# Patient Record
Sex: Male | Born: 2000 | Race: Black or African American | Hispanic: No | Marital: Single | Smoking: Current every day smoker
Health system: Southern US, Community
[De-identification: ages and names within clinical notes are randomized; demographics above are authoritative.]

## PROBLEM LIST (undated history)

## (undated) DIAGNOSIS — Y249XXA Unspecified firearm discharge, undetermined intent, initial encounter: Principal | ICD-10-CM

---
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
## 2021-10-29 ENCOUNTER — Ambulatory Visit (INDEPENDENT_AMBULATORY_CARE_PROVIDER_SITE_OTHER): Payer: Medicaid Other

## 2021-10-29 ENCOUNTER — Other Ambulatory Visit: Payer: Self-pay

## 2021-10-29 ENCOUNTER — Ambulatory Visit (HOSPITAL_COMMUNITY)
Admission: EM | Admit: 2021-10-29 | Discharge: 2021-10-29 | Disposition: A | Discharge status: Home / Self Care | Payer: Medicaid Other | Attending: Emergency Medicine | Admitting: Emergency Medicine

## 2021-10-29 ENCOUNTER — Encounter (HOSPITAL_COMMUNITY): Payer: Self-pay

## 2021-10-29 DIAGNOSIS — R509 Fever, unspecified: Secondary | ICD-10-CM

## 2021-10-29 DIAGNOSIS — J9801 Acute bronchospasm: Secondary | ICD-10-CM | POA: Diagnosis present

## 2021-10-29 DIAGNOSIS — Z20822 Contact with and (suspected) exposure to covid-19: Secondary | ICD-10-CM | POA: Diagnosis present

## 2021-10-29 DIAGNOSIS — R059 Cough, unspecified: Secondary | ICD-10-CM

## 2021-10-29 DIAGNOSIS — R062 Wheezing: Secondary | ICD-10-CM | POA: Diagnosis not present

## 2021-10-29 DIAGNOSIS — J069 Acute upper respiratory infection, unspecified: Secondary | ICD-10-CM | POA: Diagnosis present

## 2021-10-29 MED ORDER — AEROCHAMBER PLUS MISC: 2 refills | Status: DC

## 2021-10-29 MED ORDER — PROMETHAZINE-DM 6.25-15 MG/5ML PO SYRP: 5.0000 mL | ORAL_SOLUTION | Freq: Four times a day (QID) | ORAL | 0 refills | Status: DC | PRN

## 2021-10-29 MED ORDER — FLUTICASONE PROPIONATE 50 MCG/ACT NA SUSP: 2.0000 | Freq: Every day | NASAL | 0 refills | Status: DC

## 2021-10-29 MED ORDER — ALBUTEROL SULFATE HFA 108 (90 BASE) MCG/ACT IN AERS: 1.0000 | INHALATION_SPRAY | RESPIRATORY_TRACT | 0 refills | Status: DC | PRN

## 2021-10-29 MED ORDER — IBUPROFEN 600 MG PO TABS: 600.0000 mg | ORAL_TABLET | Freq: Four times a day (QID) | ORAL | 0 refills | Status: DC | PRN

## 2021-10-29 NOTE — Discharge Instructions (Addendum)
2 puffs from your albuterol inhaler using your spacer every 4 hours for 2 days, then every 6 hours for 2 days, then as needed.  You may back off on the albuterol if you start to feel better sooner.  Your COVID test will be back tomorrow.  If it is positive, you will need to mask for an additional 5 days.  Flonase, saline nasal irrigation with a Lloyd Huger Med rinse and distilled water as often as you want to prevent sinus infection, Mucinex D.  Promethazine DM for cough.  Chest x-ray was negative for pneumonia.  Below is a list of primary care practices who are taking new patients for you to follow-up with.  [facility] -multiple locations.  See website at [email or web address]  [facility] internal medicine clinic Ground Floor - [facility], [address] [place], [place], [place] [address] [phone]  [facility] at  [facility] [address] [place], [place] [address] [phone]  [facility] [address] [place], [place] [address] [phone]  [facility] [phone] [address] [place] [address]  [facility]: [address] [place] [address]  [phone]  [facility]: [address] [place] [address]  [phone]  [facility] : [address] [place] [address] [phone]  [facility]: [address] [place] [address] [phone]  [facility]: [address] [place] [address] [phone]  Dr. Oneal Grout [address] [facility] [place] [address]  [phone]  Go to www.goodrx.com  or www.[email or web address] to look up your medications. This will give you a list of where you can find your prescriptions at the most  affordable prices. Or ask the pharmacist what the cash price is, or if they have any other discount programs available to help make your medication more affordable. This can be less expensive than what you would pay with insurance.

## 2021-10-29 NOTE — ED Provider Notes (Signed)
HPI  SUBJECTIVE:  Eddie Huber is a 21 y.o. male who presents with feeling feverish, body aches, nasal congestion, rhinorrhea, postnasal drip, sore throat, cough productive of the same material as his nasal congestion, wheezing, shortness of breath and sharp chest pain present with coughing only, nausea, diarrhea.  States that he is waking up at night coughing.  No headaches, loss of sense of smell or taste, vomiting, abdominal pain, sinus pain or pressure.  No documented fevers, but does not have a thermometer at home.  No known COVID or flu exposure.  He did not get the COVID or flu vaccine.  No antipyretic in the past 6 hours.  He has been taking 400 to 600 mg of ibuprofen several times a day, DayQuil and NyQuil all of which help his symptoms.  No aggravating factors.  He has a past medical history of COVID in December 2021.  No history of asthma.  PMD: None.   History reviewed. No pertinent past medical history.  History reviewed. No pertinent surgical history.  History reviewed. No pertinent family history.  Social History   Tobacco Use   Smoking status: Never   Smokeless tobacco: Never  Substance Use Topics   Alcohol use: Not Currently   Drug use: Not Currently    No current facility-administered medications for this encounter.  Current Outpatient Medications:    albuterol (VENTOLIN HFA) 108 (90 Base) MCG/ACT inhaler, Inhale 1-2 puffs into the lungs every 4 (four) hours as needed for wheezing or shortness of breath., Disp: 1 each, Rfl: 0   fluticasone (FLONASE) 50 MCG/ACT nasal spray, Place 2 sprays into both nostrils daily., Disp: 16 g, Rfl: 0   ibuprofen (ADVIL) 600 MG tablet, Take 1 tablet (600 mg total) by mouth every 6 (six) hours as needed., Disp: 30 tablet, Rfl: 0   promethazine-dextromethorphan (PROMETHAZINE-DM) 6.25-15 MG/5ML syrup, Take 5 mLs by mouth 4 (four) times daily as needed for cough., Disp: 118 mL, Rfl: 0   Spacer/Aero-Holding Chambers (AEROCHAMBER PLUS)  inhaler, Use with inhaler, Disp: 1 each, Rfl: 2  No Known Allergies   ROS  As noted in HPI.   Physical Exam  BP 126/84 (BP Location: Left Arm)    Pulse 75    Temp 98.9 F (37.2 C) (Oral)    Resp 18    SpO2 95%   Constitutional: Well developed, well nourished, no acute distress Eyes:  EOMI, conjunctiva normal bilaterally HENT: Normocephalic, atraumatic,mucus membranes moist.  Positive erythematous, swollen turbinates, clear nasal congestion.  Positive mild maxillary, frontal sinus tenderness.  Positive postnasal drip. Neck: Positive cervical lymphadenopathy Respiratory: Normal inspiratory effort, wheezing throughout all lung fields.  Positive lateral chest wall tenderness Cardiovascular: Normal rate, regular rhythm, no murmurs rubs or gallops GI: nondistended skin: No rash, skin intact Musculoskeletal: no deformities Neurologic: Alert & oriented x 3, no focal neuro deficits Psychiatric: Speech and behavior appropriate   ED Course   Medications - No data to display  Orders Placed This Encounter  Procedures   SARS CORONAVIRUS 2 (TAT 6-24 HRS) Nasopharyngeal Nasopharyngeal Swab    Standing Status:   Standing    Number of Occurrences:   1   DG Chest 2 View    Standing Status:   Standing    Number of Occurrences:   1    Order Specific Question:   Reason for Exam (SYMPTOM  OR DIAGNOSIS REQUIRED)    Answer:   Fever, cough, wheezing rule out pneumonia    No results found for this or  any previous visit (from the past 24 hour(s)). DG Chest 2 View  Result Date: 10/29/2021 CLINICAL DATA:  Fever, cough and wheezing.  Question pneumonia. EXAM: CHEST - 2 VIEW COMPARISON:  None. FINDINGS: Heart and mediastinal structures are normal. The lungs are clear. There may be mild central bronchial thickening. No infiltrate, collapse or effusion. No bone abnormality. IMPRESSION: No pneumonia, collapse or effusion. There may be mild central bronchial thickening as might be seen with bronchitis or  reactive airway disease. Electronically Signed   By: Paulina Fusi M.D.   On: 10/29/2021 11:39    ED Clinical Impression  1. Upper respiratory tract infection, unspecified type   2. Encounter for laboratory testing for COVID-19 virus   3. Bronchospasm      ED Assessment/Plan  Checking COVID, although discussed with patient and mother he will be out of the treatment window for COVID by the time the test comes.  He does qualify for antivirals based on unvaccinated status, however, I do not think we need to start him on antivirals empirically because he is otherwise at low risk for severe disease.  Checking a chest x-ray to rule out pneumonia.  Reviewed imaging independently.  No pneumonia.  Maybe some central bronchial thickening consistent with bronchitis or reactive airway disease.  See radiology report for full details.  Chest x-ray negative for pneumonia.  Patient with URI and bronchospasm, possible bronchitis.   Home with regularly scheduled albuterol inhaler with a spacer, Flonase, Mucinex D, Promethazine DM.   If COVID is positive, he will need to mask for an additional 5 days.  Will provide primary care list and order assistance in finding PMD.  ER return precautions given  Discussed labs, imaging, MDM, treatment plan, and plan for follow-up with patient and parent.  Discussed sn/sx that should prompt return to the ED. they agree with plan.   Meds ordered this encounter  Medications   fluticasone (FLONASE) 50 MCG/ACT nasal spray    Sig: Place 2 sprays into both nostrils daily.    Dispense:  16 g    Refill:  0   albuterol (VENTOLIN HFA) 108 (90 Base) MCG/ACT inhaler    Sig: Inhale 1-2 puffs into the lungs every 4 (four) hours as needed for wheezing or shortness of breath.    Dispense:  1 each    Refill:  0   Spacer/Aero-Holding Chambers (AEROCHAMBER PLUS) inhaler    Sig: Use with inhaler    Dispense:  1 each    Refill:  2    Please educate patient on use   ibuprofen (ADVIL)  600 MG tablet    Sig: Take 1 tablet (600 mg total) by mouth every 6 (six) hours as needed.    Dispense:  30 tablet    Refill:  0   promethazine-dextromethorphan (PROMETHAZINE-DM) 6.25-15 MG/5ML syrup    Sig: Take 5 mLs by mouth 4 (four) times daily as needed for cough.    Dispense:  118 mL    Refill:  0      *This clinic note was created using Scientist, clinical (histocompatibility and immunogenetics). Therefore, there may be occasional mistakes despite careful proofreading.  ?    Domenick Gong, MD 10/29/21 [phone]

## 2021-10-29 NOTE — ED Triage Notes (Signed)
Pt c/o cough, congestion, sore throat, and body aches since Saturday. Taking OTC meds with some relief.

## 2021-10-30 LAB — SARS CORONAVIRUS 2 (TAT 6-24 HRS): SARS Coronavirus 2: NEGATIVE

## 2022-02-28 ENCOUNTER — Emergency Department (HOSPITAL_COMMUNITY)
Admission: EM | Admit: 2022-02-28 | Discharge: 2022-02-28 | Disposition: A | Discharge status: Home / Self Care | Payer: Medicaid Other | Attending: Emergency Medicine | Admitting: Emergency Medicine

## 2022-02-28 ENCOUNTER — Encounter (HOSPITAL_COMMUNITY): Payer: Self-pay | Admitting: Emergency Medicine

## 2022-02-28 DIAGNOSIS — Z202 Contact with and (suspected) exposure to infections with a predominantly sexual mode of transmission: Secondary | ICD-10-CM | POA: Diagnosis present

## 2022-02-28 LAB — HIV ANTIBODY (ROUTINE TESTING W REFLEX): HIV Screen 4th Generation wRfx: NONREACTIVE

## 2022-02-28 MED ORDER — CEFTRIAXONE SODIUM 500 MG IJ SOLR
500.0000 mg | Freq: Once | INTRAMUSCULAR | Status: AC
  Administered 2022-02-28: 500 mg via INTRAMUSCULAR
  Filled 2022-02-28: qty 500

## 2022-02-28 MED ORDER — DOXYCYCLINE HYCLATE 100 MG PO TABS: 100.0000 mg | ORAL_TABLET | Freq: Two times a day (BID) | ORAL | 0 refills | Status: DC

## 2022-02-28 MED ORDER — LIDOCAINE HCL (PF) 1 % IJ SOLN
1.0000 mL | Freq: Once | INTRAMUSCULAR | Status: AC
  Administered 2022-02-28: 2 mL
  Filled 2022-02-28: qty 5

## 2022-02-28 MED ORDER — PENICILLIN G BENZATHINE 1200000 UNIT/2ML IM SUSY
1.2000 10*6.[IU] | PREFILLED_SYRINGE | Freq: Once | INTRAMUSCULAR | Status: AC
  Administered 2022-02-28: 1.2 10*6.[IU] via INTRAMUSCULAR
  Filled 2022-02-28 (×2): qty 2

## 2022-02-28 NOTE — Discharge Instructions (Addendum)
You were treated with antibiotics that should cover you for chlamydia, gonorrhea, and syphilis at this time.  However your results are still pending.  Please check your patient portal for the results of your STI screening in 24 to 48 hours.  If you continue to have symptoms or concerns please follow-up with [facility] community [facility] for further evaluation and treatment or return to the emergency department if your symptoms significantly worsen.  In the meantime I recommend that you refrain from any sexual activity until you complete all of your treatment, and inform any recent or current sexual partners of your exposure.

## 2022-02-28 NOTE — ED Triage Notes (Signed)
Pt. Stated, My girlfriend tested positive for Chamidyda and syphilis yesterday and ive been with her.

## 2022-02-28 NOTE — ED Provider Notes (Signed)
[facility] EMERGENCY DEPARTMENT Provider Note   CSN: [id number] Arrival date & time: 02/28/22  1307     History  Chief Complaint  Patient presents with   Exposure to STD    Eddie Huber is a 21 y.o. male with no significant past medical history presents with concern for STI exposure.  Patient reports that he has been with the same girl for the last few months, she recently reported that she was positive for syphilis and chlamydia.  Patient reports that he has not had any symptoms of penile discharge, dysuria, blood in urine, difficulty with erection, testicular pain, fever, chills, nausea, vomiting.  Patient does endorse some bumps on his penis that been present for a long time, or longer than his exposure to this partner.  He denies any other sores or lesions on the penis.   Exposure to STD      Home Medications Prior to Admission medications   Medication Sig Start Date End Date Taking? Authorizing Provider  doxycycline (VIBRA-TABS) 100 MG tablet Take 1 tablet (100 mg total) by mouth 2 (two) times daily. 02/28/22  Yes Kendrew Paci H, PA-C  albuterol (VENTOLIN HFA) 108 (90 Base) MCG/ACT inhaler Inhale 1-2 puffs into the lungs every 4 (four) hours as needed for wheezing or shortness of breath. 10/29/21   Melynda Ripple, MD  fluticasone (FLONASE) 50 MCG/ACT nasal spray Place 2 sprays into both nostrils daily. 10/29/21   Melynda Ripple, MD  ibuprofen (ADVIL) 600 MG tablet Take 1 tablet (600 mg total) by mouth every 6 (six) hours as needed. 10/29/21   Melynda Ripple, MD  promethazine-dextromethorphan (PROMETHAZINE-DM) 6.25-15 MG/5ML syrup Take 5 mLs by mouth 4 (four) times daily as needed for cough. 10/29/21   Melynda Ripple, MD  Spacer/Aero-Holding Chambers (AEROCHAMBER PLUS) inhaler Use with inhaler 10/29/21   Melynda Ripple, MD      Allergies    Patient has no known allergies.    Review of Systems   Review of Systems  All other systems reviewed  and are negative.  Physical Exam Updated Vital Signs BP 132/70 (BP Location: Right Arm)   Pulse 90   Temp 97.7 F (36.5 C) (Oral)   Resp 18   SpO2 98%  Physical Exam Vitals and nursing note reviewed. Exam conducted with a chaperone present.  Constitutional:      General: He is not in acute distress.    Appearance: Normal appearance.  HENT:     Head: Normocephalic and atraumatic.  Eyes:     General:        Right eye: No discharge.        Left eye: No discharge.  Cardiovascular:     Rate and Rhythm: Normal rate and regular rhythm.  Pulmonary:     Effort: Pulmonary effort is normal. No respiratory distress.  Genitourinary:    Comments: Overall normal appearance of penis with some small bumps on the shaft without redness, or ulceration.  DDx HPV versus folliculitis versus other.  No evidence of ulceration, chancre, normal appearance of testicles.  No penile discharge noted. Musculoskeletal:        General: No deformity.  Skin:    General: Skin is warm and dry.  Neurological:     Mental Status: He is alert and oriented to person, place, and time.  Psychiatric:        Mood and Affect: Mood normal.        Behavior: Behavior normal.    ED Results / Procedures /  Treatments   Labs (all labs ordered are listed, but only abnormal results are displayed) Labs Reviewed  RPR  HIV ANTIBODY (ROUTINE TESTING W REFLEX)  GC/CHLAMYDIA PROBE AMP ([place]) NOT AT [facility]    EKG None  Radiology No results found.  Procedures Procedures    Medications Ordered in ED Medications  cefTRIAXone (ROCEPHIN) injection 500 mg (has no administration in time range)  lidocaine (PF) (XYLOCAINE) 1 % injection 1-2.1 mL (has no administration in time range)  penicillin g benzathine (BICILLIN LA) 1200000 UNIT/2ML injection 1.2 Million Units (has no administration in time range)    ED Course/ Medical Decision Making/ A&P                           Medical Decision Making Amount and/or  Complexity of Data Reviewed Labs: ordered.  Risk Prescription drug management.   This an overall well-appearing 21 year old male who presents with concern for STI exposure.  Patient reports that he is not currently having any symptoms. We discussed treatment and testing for STIs at this time.  We will test for chlamydia, gonorrhea, syphilis as well as HIV.  Low concern for epididymitis, orchitis, or other severe infection based on patient's clinical presentation.  Discussed I would recommend presumptive treatment based on positive diagnosis of girlfriend, and follow-up based on results of STI screening with [facility].  Patient understands and agrees to this plan.  Encourage safe sexual practices, and avoiding sexual contact until he has completed his treatment and is completely symptom-free.  Encouraged to inform other sexual partners.  Treated with IM penicillin, ceftriaxone, as well as oral doxycycline for 2 weeks to cover for chlamydia, gonorrhea, and syphilis. Final Clinical Impression(s) / ED Diagnoses Final diagnoses:  STD exposure    Rx / DC Orders ED Discharge Orders          Ordered    doxycycline (VIBRA-TABS) 100 MG tablet  2 times daily        02/28/22 1330              Lehman Whiteley, [place], PA-C 02/28/22 1342    Milton Ferguson, MD 03/01/22 1034

## 2022-03-01 LAB — RPR: RPR Ser Ql: NONREACTIVE

## 2022-03-04 LAB — GC/CHLAMYDIA PROBE AMP (~~LOC~~) NOT AT ARMC
Chlamydia: POSITIVE — AB
Comment: NEGATIVE
Comment: NORMAL
Neisseria Gonorrhea: NEGATIVE

## 2022-05-26 IMAGING — DX DG CHEST 2V
3 series · 3 of 3 positions shown · non-contrast
Comparison: None.

CLINICAL DATA: Fever, cough and wheezing.  Question pneumonia.

EXAM:
CHEST - 2 VIEW

[chest pa]
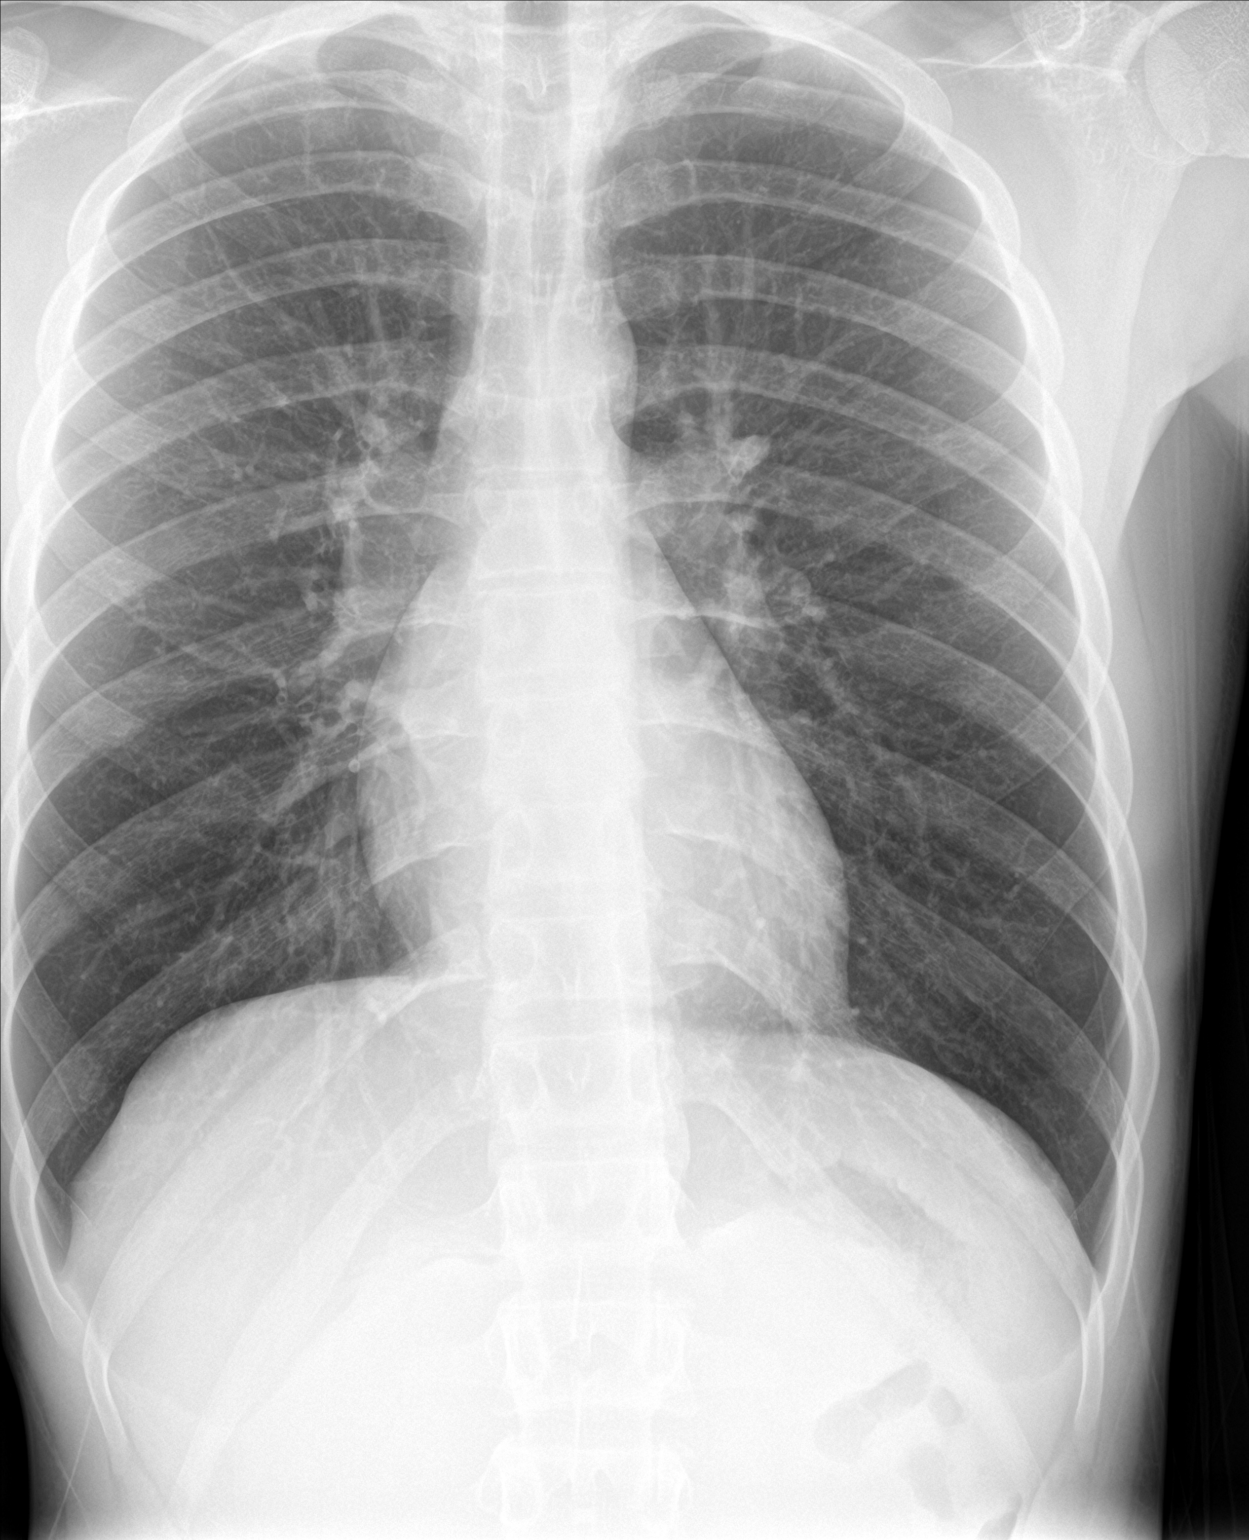

[chest lat (1 of 2)]
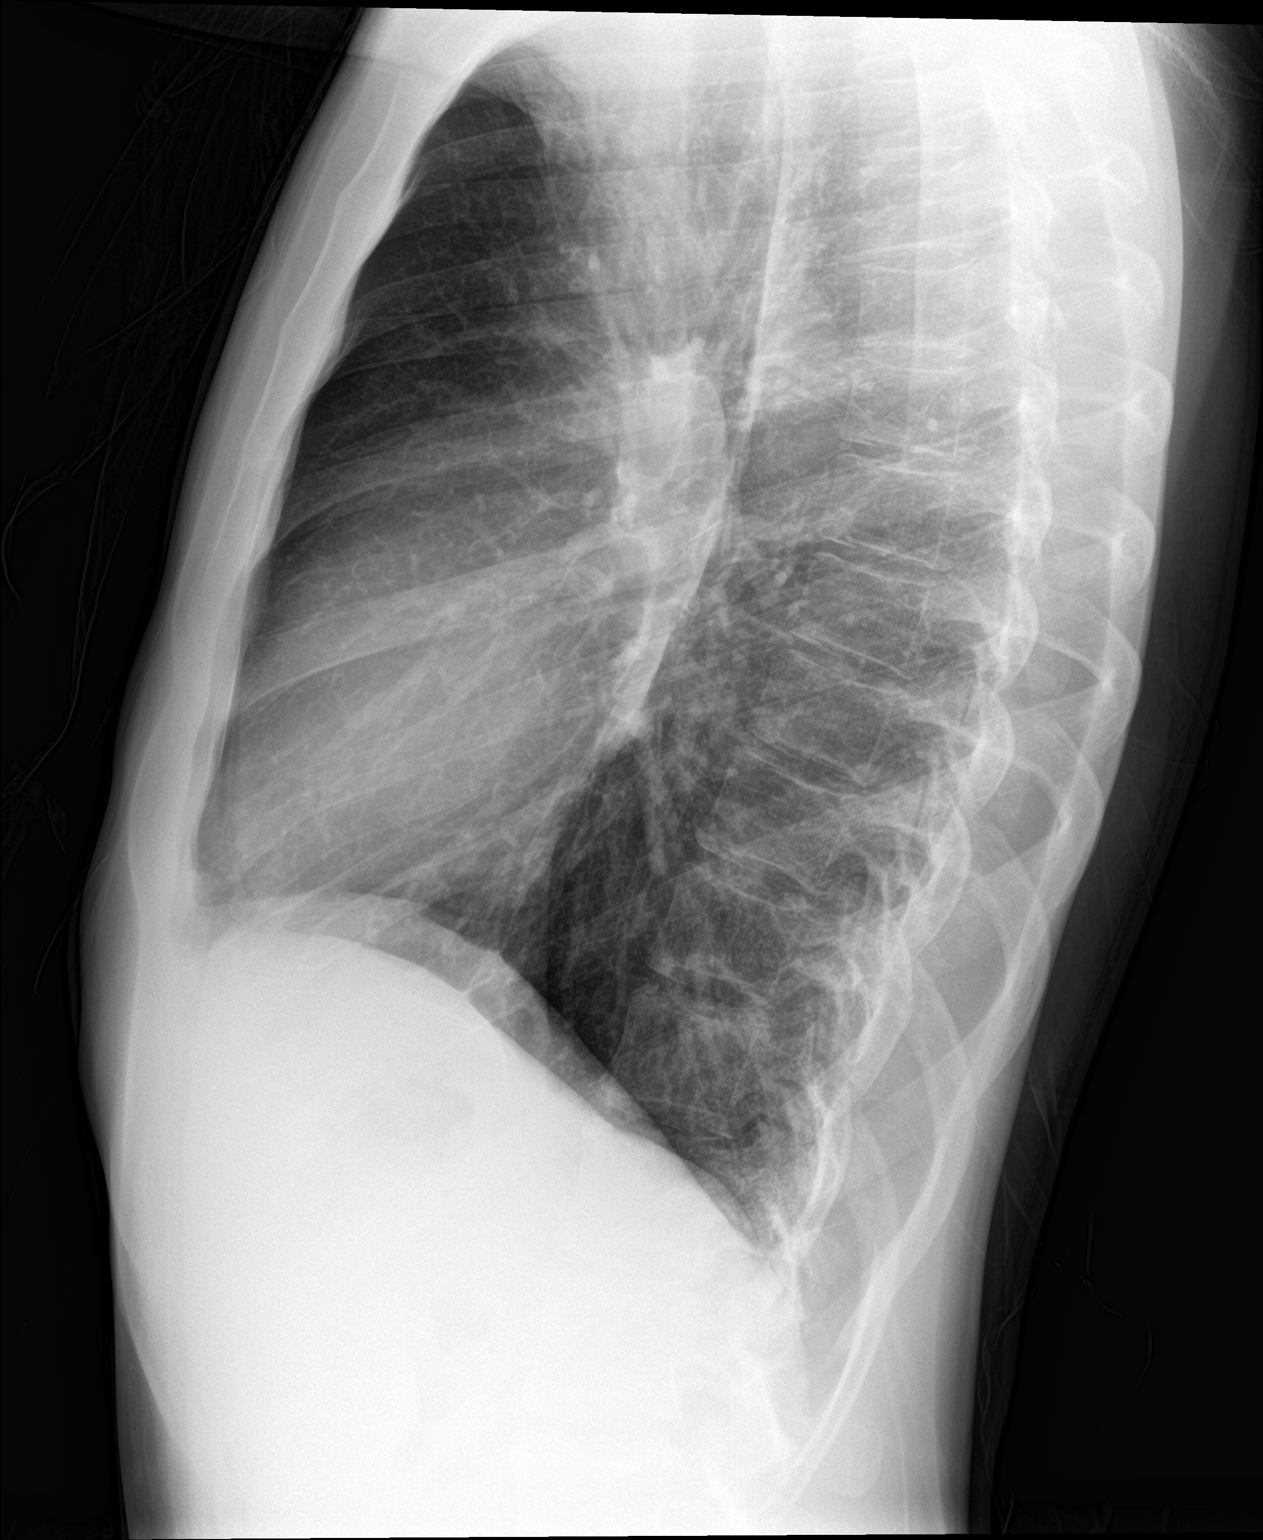

[chest lat (2 of 2)]
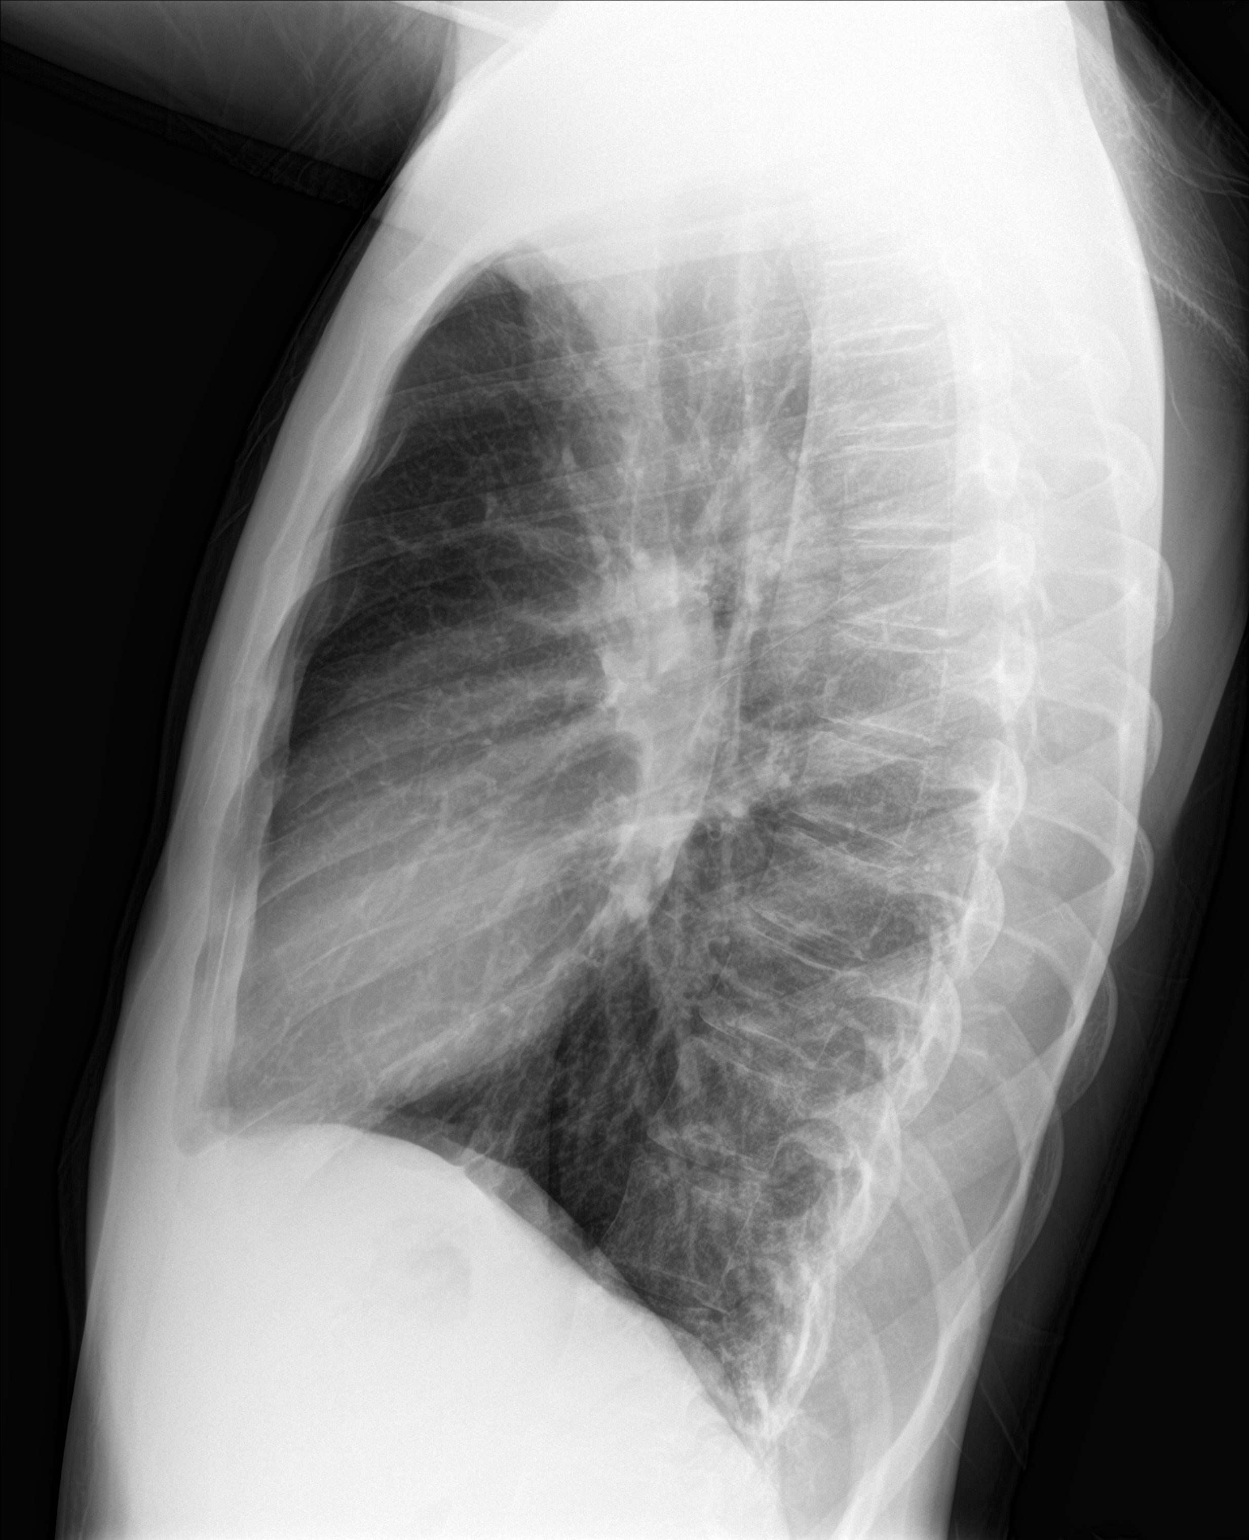

[3 of 3 positions shown; findings below may reference images not displayed]

FINDINGS: Heart and mediastinal structures are normal. The lungs are clear.
There may be mild central bronchial thickening. No infiltrate,
collapse or effusion. No bone abnormality.
IMPRESSION: No pneumonia, collapse or effusion. There may be mild central
bronchial thickening as might be seen with bronchitis or reactive
airway disease.

## 2023-02-23 ENCOUNTER — Emergency Department (HOSPITAL_COMMUNITY): Payer: Medicaid Other

## 2023-02-23 ENCOUNTER — Emergency Department (HOSPITAL_COMMUNITY)
Admission: EM | Admit: 2023-02-23 | Discharge: 2023-02-23 | Discharge status: Against Medical Advice | Payer: Medicaid Other | Attending: Student | Admitting: Student

## 2023-02-23 ENCOUNTER — Encounter (HOSPITAL_COMMUNITY): Payer: Self-pay

## 2023-02-23 DIAGNOSIS — M25562 Pain in left knee: Secondary | ICD-10-CM | POA: Diagnosis present

## 2023-02-23 DIAGNOSIS — Z5321 Procedure and treatment not carried out due to patient leaving prior to being seen by health care provider: Secondary | ICD-10-CM | POA: Insufficient documentation

## 2023-02-23 NOTE — ED Notes (Signed)
Pt told sort he was leaving and left
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
## 2024-08-20 ENCOUNTER — Other Ambulatory Visit: Payer: Self-pay

## 2024-08-20 ENCOUNTER — Encounter: Payer: Self-pay | Admitting: Intensive Care

## 2024-08-20 ENCOUNTER — Emergency Department
Admission: EM | Admit: 2024-08-20 | Discharge: 2024-08-20 | Disposition: A | Discharge status: Home / Self Care | Payer: Self-pay | Attending: Emergency Medicine | Admitting: Emergency Medicine

## 2024-08-20 DIAGNOSIS — Z202 Contact with and (suspected) exposure to infections with a predominantly sexual mode of transmission: Secondary | ICD-10-CM | POA: Insufficient documentation

## 2024-08-20 DIAGNOSIS — A749 Chlamydial infection, unspecified: Secondary | ICD-10-CM | POA: Insufficient documentation

## 2024-08-20 LAB — URINALYSIS, W/ REFLEX TO CULTURE (INFECTION SUSPECTED)
Bacteria, UA: NONE SEEN
Bilirubin Urine: NEGATIVE
Glucose, UA: NEGATIVE mg/dL
Hgb urine dipstick: NEGATIVE
Ketones, ur: NEGATIVE mg/dL
Nitrite: NEGATIVE
Protein, ur: NEGATIVE mg/dL
RBC / HPF: 0 RBC/hpf (ref 0–5)
Specific Gravity, Urine: 1.002 — ABNORMAL LOW (ref 1.005–1.030)
pH: 6 (ref 5.0–8.0)

## 2024-08-20 LAB — CHLAMYDIA/NGC RT PCR (ARMC ONLY)
Chlamydia Tr: DETECTED — AB
N gonorrhoeae: NOT DETECTED

## 2024-08-20 MED ORDER — DOXYCYCLINE MONOHYDRATE 100 MG PO TABS
100.0000 mg | ORAL_TABLET | Freq: Two times a day (BID) | ORAL | 0 refills | Status: AC
Start: 2024-08-20 — End: 2024-08-27

## 2024-08-20 NOTE — ED Triage Notes (Addendum)
 Patient reports being told he was exposed to chlamydia   Denies symptoms

## 2024-08-20 NOTE — ED Provider Notes (Signed)
 [facility] Provider Note    Event Date/Time   First MD Initiated Contact with Patient 08/20/24 1104     (approximate)   History   Exposure to STD   HPI  Eddie Huber is a 23 y.o. male who presents today with request for STD testing.  Patient reports that his partner who is currently with him tested positive for chlamydia.  Patient denies any symptoms.  He has not noticed any lesions.  No abdominal pain, nausea, vomiting, fever, chills.  There are no active problems to display for this patient.         Physical Exam   Triage Vital Signs: ED Triage Vitals  Encounter Vitals Group     BP 08/20/24 1050 120/78     Girls Systolic BP Percentile --      Girls Diastolic BP Percentile --      Boys Systolic BP Percentile --      Boys Diastolic BP Percentile --      Pulse Rate 08/20/24 1048 77     Resp 08/20/24 1048 16     Temp 08/20/24 1048 98.4 F (36.9 C)     Temp Source 08/20/24 1048 Oral     SpO2 08/20/24 1048 100 %     Weight 08/20/24 1048 154 lb (69.9 kg)     Height 08/20/24 1048 6' 3 (1.905 m)     Head Circumference --      Peak Flow --      Pain Score 08/20/24 1048 0     Pain Loc --      Pain Education --      Exclude from Growth Chart --     Most recent vital signs: Vitals:   08/20/24 1048 08/20/24 1050  BP:  120/78  Pulse: 77   Resp: 16   Temp: 98.4 F (36.9 C)   SpO2: 100%     Physical Exam Vitals and nursing note reviewed.  Constitutional:      General: Awake and alert. No acute distress.    Appearance: Normal appearance. The patient is normal weight.  HENT:     Head: Normocephalic and atraumatic.     Mouth: Mucous membranes are moist.  Eyes:     General: PERRL. Normal EOMs        Right eye: No discharge.        Left eye: No discharge.     Conjunctiva/sclera: Conjunctivae normal.  Cardiovascular:     Rate and Rhythm: Normal rate and regular rhythm.     Pulses: Normal pulses.  Pulmonary:     Effort: Pulmonary  effort is normal. No respiratory distress.     Breath sounds: Normal breath sounds.  Abdominal:     Abdomen is soft. There is no abdominal tenderness. No rebound or guarding. No distention. Declined GU exam Musculoskeletal:        General: No swelling. Normal range of motion.     Cervical back: Normal range of motion and neck supple.  Skin:    General: Skin is warm and dry.     Capillary Refill: Capillary refill takes less than 2 seconds.     Findings: No rash.  Neurological:     Mental Status: The patient is awake and alert.      ED Results / Procedures / Treatments   Labs (all labs ordered are listed, but only abnormal results are displayed) Labs Reviewed  CHLAMYDIA/NGC RT PCR ([facility] ONLY)           -  Abnormal; Notable for the following components:      Result Value   Chlamydia Tr DETECTED (*)    All other components within normal limits  URINALYSIS, W/ REFLEX TO CULTURE (INFECTION SUSPECTED) - Abnormal; Notable for the following components:   Color, Urine COLORLESS (*)    APPearance CLEAR (*)    Specific Gravity, Urine 1.002 (*)    Leukocytes,Ua TRACE (*)    All other components within normal limits  RPR  MISC [facility] TEST (SEND OUT)     EKG     RADIOLOGY     PROCEDURES:  Critical Care performed:   Procedures   MEDICATIONS ORDERED IN ED: Medications - No data to display   IMPRESSION / MDM / ASSESSMENT AND PLAN / ED COURSE  I reviewed the triage vital signs and the nursing notes.   Differential diagnosis includes, but is not limited to, gonorrhea, chlamydia, other STD, exposure to STD.  Patient is awake and alert, hemodynamically stable and afebrile.  GC chlamydia, RPR, and HIV testing obtained.  Patient requested to leave prior to results returned.  Will treat him empirically for chlamydia given that he has a known exposure.  Advised that he will need to return if his gonorrhea is positive as well.  Patient was advised that there are many other STDs  that patient could have, that we do not routinely test for in the emergency department. Patient was advised to follow up with a primary care doctor/health department to have the full panel of testing performed. Patient was advised to not have sexual intercourse until fully and properly tested and treated. Patient was advised that his partner also needs to be tested and treated. Patient understands and agrees with plan.   Patient's presentation is most consistent with acute complicated illness / injury requiring diagnostic workup.   Clinical Course as of 08/20/24 1424  Sat Aug 20, 2024  1231 Patient does not wish to wait for the results and is requesting discharge home [JP]    Clinical Course User Index [JP] Chevi Lim E, PA-C     FINAL CLINICAL IMPRESSION(S) / ED DIAGNOSES   Final diagnoses:  STD exposure  Chlamydia     Rx / DC Orders   ED Discharge Orders          Ordered    doxycycline  (ADOXA) 100 MG tablet  2 times daily        08/20/24 1232             Note:  This document was prepared using Dragon voice recognition software and may include unintentional dictation errors.   Sakiya Stepka E, PA-C 08/20/24 1425    Ernest Ronal BRAVO, MD 08/21/24 [phone]

## 2024-08-20 NOTE — Discharge Instructions (Signed)
 Please take the doxycycline  empirically for chlamydia.  You may find the results of all of your STD tests today on MyChart.  Remember that there are many other STDs that and you should follow up with a primary care doctor to have the full panel of testing performed. Please do not have sexual intercourse until fully and properly tested and treated. Your partner also needs to be tested and treated.  Do not engage in intercourse until you have completed the treatment.  Please return for any new, worsening, or changing symptoms or other concerns.

## 2024-08-20 NOTE — ED Notes (Signed)
 Pt discharged to home, instructions and medications reviewed.  Pt verbalized understanding, has no questions at this time.

## 2024-08-21 LAB — RPR: RPR Ser Ql: NONREACTIVE

## 2024-08-23 LAB — MISC LABCORP TEST (SEND OUT): Labcorp test code: 83935

## 2024-10-15 ENCOUNTER — Ambulatory Visit
Admission: EM | Admit: 2024-10-15 | Discharge: 2024-10-15 | Disposition: A | Discharge status: Home / Self Care | Payer: Self-pay

## 2024-10-15 ENCOUNTER — Encounter: Payer: Self-pay | Admitting: *Deleted

## 2024-10-15 DIAGNOSIS — J020 Streptococcal pharyngitis: Secondary | ICD-10-CM

## 2024-10-15 LAB — POCT RAPID STREP A (OFFICE): Rapid Strep A Screen: POSITIVE — AB

## 2024-10-15 MED ORDER — AMOXICILLIN 875 MG PO TABS: 875.0000 mg | ORAL_TABLET | Freq: Two times a day (BID) | ORAL | 0 refills | Status: AC

## 2024-10-15 NOTE — ED Triage Notes (Signed)
 Sore throat with subjective fever x 1 day. Pt states he took ibuprofen  pta. States painful to eat

## 2024-10-15 NOTE — ED Provider Notes (Signed)
 " [facility]    CSN: [id number] Arrival date & time: 10/15/24  1021      History   Chief Complaint Chief Complaint  Patient presents with   Sore Throat    HPI Eddie Huber is a 24 y.o. male.   Pt presents today due to 2 days of severe throat pain, headache, neck pain, difficulty swallowing, and night sweats. Pt denies fever. Pt states that he took some ibuprofen  just before presenting to clinic.   The history is provided by the patient.  Sore Throat    No past medical history on file.  There are no active problems to display for this patient.   No past surgical history on file.     Home Medications    Prior to Admission medications  Medication Sig Start Date End Date Taking? Authorizing Provider  amoxicillin  (AMOXIL ) 875 MG tablet Take 1 tablet (875 mg total) by mouth 2 (two) times daily for 10 days. 10/15/24 10/25/24 Yes Andra Corean BROCKS, PA-C  albuterol  (VENTOLIN  HFA) 108 (90 Base) MCG/ACT inhaler Inhale 1-2 puffs into the lungs every 4 (four) hours as needed for wheezing or shortness of breath. Patient not taking: Reported on 10/15/2024 10/29/21   Mortenson, Ashley, MD  doxycycline  (VIBRA -TABS) 100 MG tablet Take 1 tablet (100 mg total) by mouth 2 (two) times daily. Patient not taking: Reported on 10/15/2024 02/28/22   Prosperi, Christian H, PA-C  fluticasone  (FLONASE ) 50 MCG/ACT nasal spray Place 2 sprays into both nostrils daily. Patient not taking: Reported on 10/15/2024 10/29/21   Mortenson, Ashley, MD  ibuprofen  (ADVIL ) 600 MG tablet Take 1 tablet (600 mg total) by mouth every 6 (six) hours as needed. Patient not taking: Reported on 10/15/2024 10/29/21   Mortenson, Ashley, MD  promethazine -dextromethorphan (PROMETHAZINE -DM) 6.25-15 MG/5ML syrup Take 5 mLs by mouth 4 (four) times daily as needed for cough. Patient not taking: Reported on 10/15/2024 10/29/21   Van Knee, MD  Spacer/Aero-Holding Chambers (AEROCHAMBER PLUS) inhaler Use with  inhaler Patient not taking: Reported on 10/15/2024 10/29/21   Van Knee, MD    Family History No family history on file.  Social History Social History[1]   Allergies   Patient has no known allergies.   Review of Systems Review of Systems   Physical Exam Triage Vital Signs ED Triage Vitals  Encounter Vitals Group     BP 10/15/24 1034 128/81     Girls Systolic BP Percentile --      Girls Diastolic BP Percentile --      Boys Systolic BP Percentile --      Boys Diastolic BP Percentile --      Pulse Rate 10/15/24 1034 92     Resp 10/15/24 1034 16     Temp 10/15/24 1034 98.8 F (37.1 C)     Temp Source 10/15/24 1034 Oral     SpO2 10/15/24 1034 98 %     Weight --      Height --      Head Circumference --      Peak Flow --      Pain Score 10/15/24 1031 8     Pain Loc --      Pain Education --      Exclude from Growth Chart --    No data found.  Updated Vital Signs BP 128/81 (BP Location: Left Arm)   Pulse 92   Temp 98.8 F (37.1 C) (Oral)   Resp 16   SpO2 98%   Visual Acuity  Right Eye Distance:   Left Eye Distance:   Bilateral Distance:    Right Eye Near:   Left Eye Near:    Bilateral Near:     Physical Exam Vitals and nursing note reviewed.  Constitutional:      General: He is not in acute distress.    Appearance: Normal appearance. He is not ill-appearing, toxic-appearing or diaphoretic.  HENT:     Mouth/Throat:     Mouth: Mucous membranes are moist.     Pharynx: Oropharyngeal exudate and posterior oropharyngeal erythema present.  Eyes:     General: No scleral icterus. Cardiovascular:     Rate and Rhythm: Normal rate and regular rhythm.     Heart sounds: Normal heart sounds.  Pulmonary:     Effort: Pulmonary effort is normal. No respiratory distress.     Breath sounds: Normal breath sounds. No wheezing or rhonchi.  Musculoskeletal:     Cervical back: Tenderness present.  Lymphadenopathy:     Cervical: Cervical adenopathy present.   Skin:    General: Skin is warm.  Neurological:     Mental Status: He is alert and oriented to person, place, and time.  Psychiatric:        Mood and Affect: Mood normal.        Behavior: Behavior normal.      UC Treatments / Results  Labs (all labs ordered are listed, but only abnormal results are displayed) Labs Reviewed  POCT RAPID STREP A (OFFICE) - Abnormal; Notable for the following components:      Result Value   Rapid Strep A Screen Positive (*)    All other components within normal limits    EKG   Radiology No results found.  Procedures Procedures (including critical care time)  Medications Ordered in UC Medications - No data to display  Initial Impression / Assessment and Plan / UC Course  I have reviewed the triage vital signs and the nursing notes.  Pertinent labs & imaging results that were available during my care of the patient were reviewed by me and considered in my medical decision making (see chart for details).     Final Clinical Impressions(s) / UC Diagnoses   Final diagnoses:  Strep pharyngitis     Discharge Instructions      You have been diagnosed with strep throat today and prescribed an antibiotic.  You will be contagious until you have completed 24 hours of antibiotics.  You should discard your toothbrush in 24 hours as well.  You may use ibuprofen  and/or Tylenol as needed for pain and fever.  Chloraseptic and Cepacol make a numbing throat lozenges that can be obtained over-the-counter that is helpful for throat pain as well.     ED Prescriptions     Medication Sig Dispense Auth. Provider   amoxicillin  (AMOXIL ) 875 MG tablet Take 1 tablet (875 mg total) by mouth 2 (two) times daily for 10 days. 20 tablet Andra Corean BROCKS, PA-C      PDMP not reviewed this encounter.    [1]  Social History Tobacco Use   Smoking status: Every Day    Types: Cigars   Smokeless tobacco: Never  Vaping Use   Vaping status: Some Days   Substance Use Topics   Alcohol use: Yes   Drug use: Yes    Types: Marijuana     Andra Corean BROCKS, PA-C 10/15/24 1059  "

## 2024-10-15 NOTE — Discharge Instructions (Signed)
 You have been diagnosed with strep throat today and prescribed an antibiotic.  You will be contagious until you have completed 24 hours of antibiotics.  You should discard your toothbrush in 24 hours as well.  You may use ibuprofen  and/or Tylenol  as needed for pain and fever.  Chloraseptic and Cepacol make a numbing throat lozenges that can be obtained over-the-counter that is helpful for throat pain as well.

## 2024-11-03 ENCOUNTER — Encounter (HOSPITAL_COMMUNITY): Payer: Self-pay

## 2024-11-03 ENCOUNTER — Other Ambulatory Visit: Payer: Self-pay

## 2024-11-03 ENCOUNTER — Emergency Department (HOSPITAL_COMMUNITY): Payer: Self-pay

## 2024-11-03 ENCOUNTER — Observation Stay (HOSPITAL_COMMUNITY): Admission: EM | Admit: 2024-11-03 | Discharge: 2024-11-03 | Discharge status: Against Medical Advice | Payer: Self-pay

## 2024-11-03 DIAGNOSIS — Y249XXA Unspecified firearm discharge, undetermined intent, initial encounter: Principal | ICD-10-CM

## 2024-11-03 DIAGNOSIS — E876 Hypokalemia: Secondary | ICD-10-CM

## 2024-11-03 HISTORY — DX: Unspecified firearm discharge, undetermined intent, initial encounter: Y24.9XXA

## 2024-11-03 LAB — COMPREHENSIVE METABOLIC PANEL WITH GFR
ALT: 21 U/L (ref 0–44)
AST: 31 U/L (ref 15–41)
Albumin: 4.3 g/dL (ref 3.5–5.0)
Alkaline Phosphatase: 62 U/L (ref 38–126)
Anion gap: 14 (ref 5–15)
BUN: 10 mg/dL (ref 6–20)
CO2: 22 mmol/L (ref 22–32)
Calcium: 9.3 mg/dL (ref 8.9–10.3)
Chloride: 104 mmol/L (ref 98–111)
Creatinine, Ser: 0.93 mg/dL (ref 0.61–1.24)
GFR, Estimated: >60 mL/min
Glucose, Bld: 138 mg/dL — ABNORMAL HIGH (ref 70–99)
Potassium: 3.4 mmol/L — ABNORMAL LOW (ref 3.5–5.1)
Sodium: 140 mmol/L (ref 135–145)
Total Bilirubin: 0.2 mg/dL (ref 0.0–1.2)
Total Protein: 7.4 g/dL (ref 6.5–8.1)

## 2024-11-03 LAB — ETHANOL: Alcohol, Ethyl (B): <15 mg/dL

## 2024-11-03 LAB — I-STAT CHEM 8, ED
BUN: 12 mg/dL (ref 6–20)
Calcium, Ion: 1.11 mmol/L — ABNORMAL LOW (ref 1.15–1.40)
Chloride: 103 mmol/L (ref 98–111)
Creatinine, Ser: 0.9 mg/dL (ref 0.61–1.24)
Glucose, Bld: 151 mg/dL — ABNORMAL HIGH (ref 70–99)
HCT: 51 % (ref 39.0–52.0)
Hemoglobin: 17.3 g/dL — ABNORMAL HIGH (ref 13.0–17.0)
Potassium: 3.6 mmol/L (ref 3.5–5.1)
Sodium: 143 mmol/L (ref 135–145)
TCO2: 25 mmol/L (ref 22–32)

## 2024-11-03 LAB — CBC
HCT: 47 % (ref 39.0–52.0)
Hemoglobin: 14.9 g/dL (ref 13.0–17.0)
MCH: 28.5 pg (ref 26.0–34.0)
MCHC: 31.7 g/dL (ref 30.0–36.0)
MCV: 89.9 fL (ref 80.0–100.0)
Platelets: 207 10*3/uL (ref 150–400)
RBC: 5.23 MIL/uL (ref 4.22–5.81)
RDW: 14.5 % (ref 11.5–15.5)
WBC: 14.4 10*3/uL — ABNORMAL HIGH (ref 4.0–10.5)
nRBC: 0 % (ref 0.0–0.2)

## 2024-11-03 LAB — I-STAT CG4 LACTIC ACID, ED: Lactic Acid, Venous: 3.1 mmol/L (ref 0.5–1.9)

## 2024-11-03 LAB — PROTIME-INR
INR: 1 (ref 0.8–1.2)
Prothrombin Time: 13.4 s (ref 11.4–15.2)

## 2024-11-03 LAB — SAMPLE TO BLOOD BANK

## 2024-11-03 MED ORDER — OXYCODONE HCL 5 MG PO TABS
5.0000 mg | ORAL_TABLET | ORAL | Status: DC | PRN
  Administered 2024-11-03: 10 mg via ORAL
  Filled 2024-11-03: qty 2

## 2024-11-03 MED ORDER — POLYETHYLENE GLYCOL 3350 17 G PO PACK: 17.0000 g | PACK | Freq: Every day | ORAL | Status: DC | PRN

## 2024-11-03 MED ORDER — ENOXAPARIN SODIUM 30 MG/0.3ML IJ SOSY: 30.0000 mg | PREFILLED_SYRINGE | Freq: Two times a day (BID) | INTRAMUSCULAR | Status: DC

## 2024-11-03 MED ORDER — ONDANSETRON 4 MG PO TBDP: 4.0000 mg | ORAL_TABLET | Freq: Four times a day (QID) | ORAL | Status: DC | PRN

## 2024-11-03 MED ORDER — METHOCARBAMOL 1000 MG/10ML IJ SOLN: 500.0000 mg | Freq: Three times a day (TID) | INTRAMUSCULAR | Status: DC

## 2024-11-03 MED ORDER — POTASSIUM CHLORIDE 20 MEQ PO PACK: 40.0000 meq | PACK | Freq: Once | ORAL | Status: DC

## 2024-11-03 MED ORDER — HYDRALAZINE HCL 20 MG/ML IJ SOLN: 10.0000 mg | INTRAMUSCULAR | Status: DC | PRN

## 2024-11-03 MED ORDER — CEFAZOLIN SODIUM-DEXTROSE 2-4 GM/100ML-% IV SOLN
2.0000 g | Freq: Once | INTRAVENOUS | Status: AC
  Administered 2024-11-03: 2 g via INTRAVENOUS

## 2024-11-03 MED ORDER — IOHEXOL 350 MG/ML SOLN
75.0000 mL | Freq: Once | INTRAVENOUS | Status: AC | PRN
  Administered 2024-11-03: 75 mL via INTRAVENOUS

## 2024-11-03 MED ORDER — METOPROLOL TARTRATE 5 MG/5ML IV SOLN: 5.0000 mg | Freq: Four times a day (QID) | INTRAVENOUS | Status: DC | PRN

## 2024-11-03 MED ORDER — SENNA 8.6 MG PO TABS
1.0000 | ORAL_TABLET | Freq: Two times a day (BID) | ORAL | Status: DC
  Administered 2024-11-03: 8.6 mg via ORAL
  Filled 2024-11-03: qty 1

## 2024-11-03 MED ORDER — HYDROMORPHONE HCL 1 MG/ML IJ SOLN: 0.5000 mg | INTRAMUSCULAR | Status: DC | PRN

## 2024-11-03 MED ORDER — FENTANYL CITRATE (PF) 50 MCG/ML IJ SOSY
50.0000 ug | PREFILLED_SYRINGE | Freq: Once | INTRAMUSCULAR | Status: AC
  Administered 2024-11-03: 50 ug via INTRAVENOUS
  Filled 2024-11-03: qty 1

## 2024-11-03 MED ORDER — TETANUS-DIPHTH-ACELL PERTUSSIS 5-2-15.5 LF-MCG/0.5 IM SUSP: 0.5000 mL | Freq: Once | INTRAMUSCULAR | Status: DC

## 2024-11-03 MED ORDER — METHOCARBAMOL 500 MG PO TABS
500.0000 mg | ORAL_TABLET | Freq: Three times a day (TID) | ORAL | Status: DC
  Administered 2024-11-03: 500 mg via ORAL
  Filled 2024-11-03: qty 1

## 2024-11-03 MED ORDER — ONDANSETRON HCL 4 MG/2ML IJ SOLN: 4.0000 mg | Freq: Four times a day (QID) | INTRAMUSCULAR | Status: DC | PRN

## 2024-11-03 MED ORDER — MELATONIN 3 MG PO TABS: 3.0000 mg | ORAL_TABLET | Freq: Every evening | ORAL | Status: DC | PRN

## 2024-11-03 MED ORDER — ACETAMINOPHEN 500 MG PO TABS
1000.0000 mg | ORAL_TABLET | Freq: Three times a day (TID) | ORAL | Status: DC
  Administered 2024-11-03: 1000 mg via ORAL
  Filled 2024-11-03: qty 2

## 2024-11-03 MED ORDER — LACTATED RINGERS IV SOLN: INTRAVENOUS | Status: DC

## 2024-11-03 MED ORDER — SODIUM CHLORIDE 0.9 % IV BOLUS
1000.0000 mL | Freq: Once | INTRAVENOUS | Status: AC
  Administered 2024-11-03: 1000 mL via INTRAVENOUS
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
## 2024-11-03 NOTE — ED Notes (Signed)
 C-collar placed on pt.

## 2024-11-03 NOTE — Progress Notes (Signed)
 Transition of Care [facility]) - CAGE-AID Screening   Patient Details  Name: Eddie Huber MRN: [id number] Date of Birth: 2001/06/19  [facility] of [facility] CM/SW Contact:    Sallyanne MALVA Mettle, RN Phone Number: 11/03/2024, 10:20 PM   Clinical Narrative: Pt reports occasional alcohol, marijuana and a perc every now and then.   CAGE-AID Screening:    Have You Ever Felt You Ought to Cut Down on Your Drinking or Drug Use?: No Have People Annoyed You By Critizing Your Drinking Or Drug Use?: Yes Have You Felt Bad Or Guilty About Your Drinking Or Drug Use?: Yes Have You Ever Had a Drink or Used Drugs First Thing In The Morning to Steady Your Nerves or to Get Rid of a Hangover?: No CAGE-AID Score: 2  Substance Abuse Education Offered: No
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
## 2024-11-03 NOTE — ED Notes (Signed)
Patient transported to CT by TRN ?
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
## 2024-11-03 NOTE — Progress Notes (Signed)
 Orthopedic Tech Progress Note Patient Details:  Eddie Huber [birth year] [id number]  Patient ID: Eddie Huber, male   DOB: 2001/08/09, 24 y.o.   MRN: [id number] I attended trauma page. Chandra Dorn PARAS 11/03/2024, 10:19 PM

## 2024-11-05 ENCOUNTER — Emergency Department (HOSPITAL_COMMUNITY): Payer: Self-pay

## 2024-11-05 ENCOUNTER — Encounter (HOSPITAL_COMMUNITY): Payer: Self-pay | Admitting: *Deleted

## 2024-11-05 ENCOUNTER — Encounter (HOSPITAL_COMMUNITY): Payer: Self-pay

## 2024-11-05 ENCOUNTER — Ambulatory Visit (HOSPITAL_COMMUNITY)
Admission: EM | Admit: 2024-11-05 | Discharge: 2024-11-05 | Disposition: A | Discharge status: Home / Self Care | Payer: Self-pay | Attending: Nurse Practitioner | Admitting: Nurse Practitioner

## 2024-11-05 ENCOUNTER — Emergency Department (HOSPITAL_COMMUNITY)
Admission: EM | Admit: 2024-11-05 | Discharge: 2024-11-05 | Disposition: A | Discharge status: Home / Self Care | Payer: Self-pay | Source: Ambulatory Visit

## 2024-11-05 DIAGNOSIS — S42102B Fracture of unspecified part of scapula, left shoulder, initial encounter for open fracture: Secondary | ICD-10-CM | POA: Insufficient documentation

## 2024-11-05 DIAGNOSIS — Y249XXA Unspecified firearm discharge, undetermined intent, initial encounter: Secondary | ICD-10-CM

## 2024-11-05 DIAGNOSIS — R2232 Localized swelling, mass and lump, left upper limb: Secondary | ICD-10-CM | POA: Insufficient documentation

## 2024-11-05 DIAGNOSIS — W3400XA Accidental discharge from unspecified firearms or gun, initial encounter: Secondary | ICD-10-CM | POA: Insufficient documentation

## 2024-11-05 DIAGNOSIS — S42102G Fracture of unspecified part of scapula, left shoulder, subsequent encounter for fracture with delayed healing: Secondary | ICD-10-CM

## 2024-11-05 DIAGNOSIS — T79A12D Traumatic compartment syndrome of left upper extremity, subsequent encounter: Secondary | ICD-10-CM

## 2024-11-05 DIAGNOSIS — F17290 Nicotine dependence, other tobacco product, uncomplicated: Secondary | ICD-10-CM | POA: Insufficient documentation

## 2024-11-05 LAB — BASIC METABOLIC PANEL WITH GFR
Anion gap: 11 (ref 5–15)
BUN: 8 mg/dL (ref 6–20)
CO2: 25 mmol/L (ref 22–32)
Calcium: 10.1 mg/dL (ref 8.9–10.3)
Chloride: 102 mmol/L (ref 98–111)
Creatinine, Ser: 0.81 mg/dL (ref 0.61–1.24)
GFR, Estimated: >60 mL/min
Glucose, Bld: 99 mg/dL (ref 70–99)
Potassium: 4.7 mmol/L (ref 3.5–5.1)
Sodium: 137 mmol/L (ref 135–145)

## 2024-11-05 LAB — CBC
HCT: 42.9 % (ref 39.0–52.0)
Hemoglobin: 14.1 g/dL (ref 13.0–17.0)
MCH: 28.3 pg (ref 26.0–34.0)
MCHC: 32.9 g/dL (ref 30.0–36.0)
MCV: 86 fL (ref 80.0–100.0)
Platelets: 200 10*3/uL (ref 150–400)
RBC: 4.99 MIL/uL (ref 4.22–5.81)
RDW: 14.1 % (ref 11.5–15.5)
WBC: 7.9 10*3/uL (ref 4.0–10.5)
nRBC: 0 % (ref 0.0–0.2)

## 2024-11-05 LAB — I-STAT CHEM 8, ED
BUN: 9 mg/dL (ref 6–20)
Calcium, Ion: 1.11 mmol/L — ABNORMAL LOW (ref 1.15–1.40)
Chloride: 103 mmol/L (ref 98–111)
Creatinine, Ser: 0.8 mg/dL (ref 0.61–1.24)
Glucose, Bld: 99 mg/dL (ref 70–99)
HCT: 46 % (ref 39.0–52.0)
Hemoglobin: 15.6 g/dL (ref 13.0–17.0)
Potassium: 4.5 mmol/L (ref 3.5–5.1)
Sodium: 139 mmol/L (ref 135–145)
TCO2: 27 mmol/L (ref 22–32)

## 2024-11-05 MED ORDER — ONDANSETRON HCL 4 MG/2ML IJ SOLN
4.0000 mg | Freq: Once | INTRAMUSCULAR | Status: AC
  Administered 2024-11-05: 4 mg via INTRAVENOUS
  Filled 2024-11-05: qty 2

## 2024-11-05 MED ORDER — DOXYCYCLINE HYCLATE 100 MG PO CAPS: 100.0000 mg | ORAL_CAPSULE | Freq: Two times a day (BID) | ORAL | 0 refills | Status: AC

## 2024-11-05 MED ORDER — SULFAMETHOXAZOLE-TRIMETHOPRIM 800-160 MG PO TABS: 1.0000 | ORAL_TABLET | Freq: Once | ORAL | Status: DC

## 2024-11-05 MED ORDER — IOHEXOL 350 MG/ML SOLN
50.0000 mL | Freq: Once | INTRAVENOUS | Status: AC | PRN
  Administered 2024-11-05: 50 mL via INTRAVENOUS

## 2024-11-05 MED ORDER — OXYCODONE-ACETAMINOPHEN 5-325 MG PO TABS: 1.0000 | ORAL_TABLET | Freq: Four times a day (QID) | ORAL | 0 refills | Status: AC | PRN

## 2024-11-05 MED ORDER — CEPHALEXIN 500 MG PO CAPS: 500.0000 mg | ORAL_CAPSULE | Freq: Four times a day (QID) | ORAL | 0 refills | Status: AC

## 2024-11-05 MED ORDER — MORPHINE SULFATE (PF) 4 MG/ML IV SOLN
4.0000 mg | Freq: Once | INTRAVENOUS | Status: AC
  Administered 2024-11-05: 4 mg via INTRAVENOUS
  Filled 2024-11-05: qty 1

## 2024-11-05 MED ORDER — CEPHALEXIN 250 MG PO CAPS: 500.0000 mg | ORAL_CAPSULE | Freq: Once | ORAL | Status: DC

## 2024-11-05 MED ORDER — NAPROXEN 500 MG PO TABS: 500.0000 mg | ORAL_TABLET | Freq: Two times a day (BID) | ORAL | 0 refills | Status: AC
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
## 2024-11-05 NOTE — Discharge Instructions (Addendum)
 Your workup today was reassuring overall.  Please use the sling when out and about and try to keep your arm elevated while sitting which should help with the swelling.  Please finish your Augmentin for the strep throat.  Once you finish this start the cephalexin .  Take the doxycycline  as prescribed.  Take naproxen  for pain.  If you are still having pain take the Percocet.  Do not drive or drink alcohol while taking the Percocet as it may make you drowsy.  Please call and schedule a follow-up appointment with the orthopedist at the number provided.  Return to the emergency department for fevers or worsening symptoms.

## 2024-11-05 NOTE — Discharge Instructions (Addendum)
 Your dressing was changed and a sling was provided.  You are discharged to the emergency department for reevaluation of your scapula fracture with possible compartment syndrome due to the pain and swelling in your left hand wrist and forearm.
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
